# Patient Record
Sex: Female | Born: 1975 | Race: White | Hispanic: No | Marital: Married | Smoking: Current every day smoker
Health system: Southern US, Community
[De-identification: ages and names within clinical notes are randomized; demographics above are authoritative.]

## PROBLEM LIST (undated history)

## (undated) DIAGNOSIS — B20 Human immunodeficiency virus [HIV] disease: Secondary | ICD-10-CM

## (undated) DIAGNOSIS — Z21 Asymptomatic human immunodeficiency virus [HIV] infection status: Secondary | ICD-10-CM

## (undated) DIAGNOSIS — R011 Cardiac murmur, unspecified: Secondary | ICD-10-CM

## (undated) HISTORY — DX: Cardiac murmur, unspecified: R01.1

## (undated) HISTORY — DX: Human immunodeficiency virus (HIV) disease: B20

## (undated) HISTORY — PX: APPENDECTOMY: SHX54

## (undated) HISTORY — DX: Asymptomatic human immunodeficiency virus (hiv) infection status: Z21

---

## 2007-06-07 HISTORY — PX: OTHER SURGICAL HISTORY: SHX169

## 2012-08-04 LAB — HM PAP SMEAR

## 2013-02-27 ENCOUNTER — Telehealth: Payer: Self-pay

## 2013-02-27 NOTE — Telephone Encounter (Signed)
LVM for CB.  No information

## 2013-02-28 ENCOUNTER — Ambulatory Visit (INDEPENDENT_AMBULATORY_CARE_PROVIDER_SITE_OTHER): Payer: 59 | Admitting: Family Medicine

## 2013-02-28 ENCOUNTER — Encounter: Payer: Self-pay | Admitting: Family Medicine

## 2013-02-28 VITALS — BP 110/70 | HR 71 | Temp 98.1°F | Ht 63.5 in | Wt 140.6 lb

## 2013-02-28 DIAGNOSIS — Z23 Encounter for immunization: Secondary | ICD-10-CM

## 2013-02-28 DIAGNOSIS — F4323 Adjustment disorder with mixed anxiety and depressed mood: Secondary | ICD-10-CM

## 2013-02-28 NOTE — Telephone Encounter (Signed)
Unable to reach prior to visit  

## 2013-02-28 NOTE — Progress Notes (Signed)
  Subjective:    Patient ID: Monique [name], female    DOB: Jul 10, 1975, 36 y.o.   MRN: [id number]  HPI New to establish.  Recently moved from [place].  Last pap 3/14.  Had extensive PE in June when she got new insurance for [employer].  Adjustment disorder- pt left her friends, family, and job in [place] when she moved here w/ her husband.  Husband is working and kids are in [name] all day.  Pt is feeling very alone.  This is the 1st time she has not worked.  They are in process of getting 2nd car so she isn't 'stuck' at home all day.  Pt doesn't feel comfortable enough w/ her English to venture out and meet people.  Calls home frequently.  Sleeping well.  Feels she will be happy if given time to adjust.  Not interested in meds.  Denies SI/HI.  Eating well.  No N/V/D.   Review of Systems For ROS see HPI     Objective:   Physical Exam  Vitals reviewed. Constitutional: She is oriented to person, place, and time. She appears well-developed and well-nourished. No distress.  HENT:  Head: Normocephalic and atraumatic.  Neck: Normal range of motion. Neck supple. No thyromegaly present.  Cardiovascular: Normal rate, regular rhythm, normal heart sounds and intact distal pulses.   No murmur heard. Pulmonary/Chest: Effort normal and breath sounds normal. No respiratory distress. She has no wheezes. She has no rales.  Lymphadenopathy:    She has no cervical adenopathy.  Neurological: She is alert and oriented to person, place, and time.  Skin: Skin is warm and dry.  Psychiatric: She has a normal mood and affect. Her behavior is normal. Thought content normal.          Assessment & Plan:

## 2013-02-28 NOTE — Patient Instructions (Addendum)
Schedule your complete physical for next summer Please call with any questions or concerns If you continue to feel sad or if things worsen instead of improve, please call me! Welcome!  We're glad to have you! Happy Early [place] Ouch!

## 2013-03-03 NOTE — Assessment & Plan Note (Signed)
New.  Reviewed that pt's current emotions are normal and to be expected given such a huge life change.  Pt hopeful about the move and not in a place of despair.  Still has strong ties to family and friends in [place].  Pt not interested in starting meds but reviewed red flags w/ both her and her husband that shoulder prompt return or them to reconsider medication.  Will follow closely.

## 2013-04-10 ENCOUNTER — Encounter: Payer: Self-pay | Admitting: Internal Medicine

## 2013-04-10 ENCOUNTER — Ambulatory Visit (HOSPITAL_BASED_OUTPATIENT_CLINIC_OR_DEPARTMENT_OTHER)
Admission: RE | Admit: 2013-04-10 | Discharge: 2013-04-10 | Disposition: A | Discharge status: Home / Self Care | Payer: 59 | Source: Ambulatory Visit | Attending: Internal Medicine | Admitting: Internal Medicine

## 2013-04-10 ENCOUNTER — Ambulatory Visit (INDEPENDENT_AMBULATORY_CARE_PROVIDER_SITE_OTHER): Payer: 59 | Admitting: Internal Medicine

## 2013-04-10 VITALS — BP 131/84 | HR 91 | Temp 98.1°F

## 2013-04-10 DIAGNOSIS — S92919A Unspecified fracture of unspecified toe(s), initial encounter for closed fracture: Secondary | ICD-10-CM | POA: Insufficient documentation

## 2013-04-10 DIAGNOSIS — S90129A Contusion of unspecified lesser toe(s) without damage to nail, initial encounter: Secondary | ICD-10-CM | POA: Insufficient documentation

## 2013-04-10 DIAGNOSIS — X58XXXA Exposure to other specified factors, initial encounter: Secondary | ICD-10-CM | POA: Insufficient documentation

## 2013-04-10 DIAGNOSIS — S99921A Unspecified injury of right foot, initial encounter: Secondary | ICD-10-CM

## 2013-04-10 DIAGNOSIS — S8990XA Unspecified injury of unspecified lower leg, initial encounter: Secondary | ICD-10-CM

## 2013-04-10 DIAGNOSIS — M7989 Other specified soft tissue disorders: Secondary | ICD-10-CM | POA: Insufficient documentation

## 2013-04-10 NOTE — Assessment & Plan Note (Signed)
Acute injury to the fifth toe, will check an x-ray to rule out a fracture, otherwise recommend resting, ibuprofen and call if not improving soon.

## 2013-04-10 NOTE — Patient Instructions (Signed)
Motrin (ibuprofen) 200 mg 2 tablets every 6 hours as needed for pain. Always take it with food because may cause gastritis and ulcers. If you notice nausea, stomach pain, change in the color of stools --->  Stop the medicine and let us know   Get the XR at [facility], corner of [address] (10 minutes form here); they are open 24/7 [address]  [place], [place] [address] [phone]  Call if no better in 7-10 days

## 2013-04-10 NOTE — Progress Notes (Signed)
  Subjective:    Patient ID: Monique [name], female    DOB: 01/18/1976, 37 y.o.   MRN: [id number]  HPI Acute visit Was playing with her son at home and her 5th R toe got caught and was forcefully lateralized, Developed immediate pain, swelling. She thinks she broke her toe. Today the pain is about the same sometimes up to 9/10 on also there is some discoloration.   Past Medical History  Diagnosis Date  . Heart murmur   . HIV infection     Testing   Past Surgical History  Procedure Laterality Date  . Appendectomy    . Miscarriage  2009   History   Social History  . Marital Status: Married    Spouse Name: N/A    Number of Children: 2  . Years of Education: N/A   Occupational History  . Not on file.   Social History Main Topics  . Smoking status: Current Every Day Smoker -- 1.00 packs/day    Types: Cigarettes  . Smokeless tobacco: Not on file  . Alcohol Use: No  . Drug Use: Not on file  . Sexual Activity: Not on file   Other Topics Concern  . Not on file   Social History Narrative  . No narrative on file    Review of Systems     Objective:   Physical Exam BP 131/84  Pulse 91  Temp(Src) 98.1 F (36.7 C)  SpO2 99%  General -- alert, well-developed, NAD.  Extremities-- no pretibial edema bilaterally  Left foot normal Right: Fifth toe slightly swollen, good capillary refill, painful to palpation, no obvious deformities. Neurologic--  alert & oriented X3. Speech normal, gait normal, strength normal in all extremities.  Psych-- Cognition and judgment appear intact. Cooperative with normal attention span and concentration. No anxious appearing , no depressed appearing.     Assessment & Plan:

## 2013-04-11 ENCOUNTER — Encounter: Payer: Self-pay | Admitting: Internal Medicine

## 2013-04-25 ENCOUNTER — Telehealth: Payer: Self-pay | Admitting: *Deleted

## 2014-04-15 NOTE — Telephone Encounter (Signed)
error 

## 2015-03-01 IMAGING — CR DG TOE 5TH 2+V*R*
3 series · 3 of 3 positions shown · non-contrast
Comparison: None.

CLINICAL DATA: Injured toe last night with swelling and bruising

EXAM:
RIGHT FIFTH TOE

[t toes ap right]
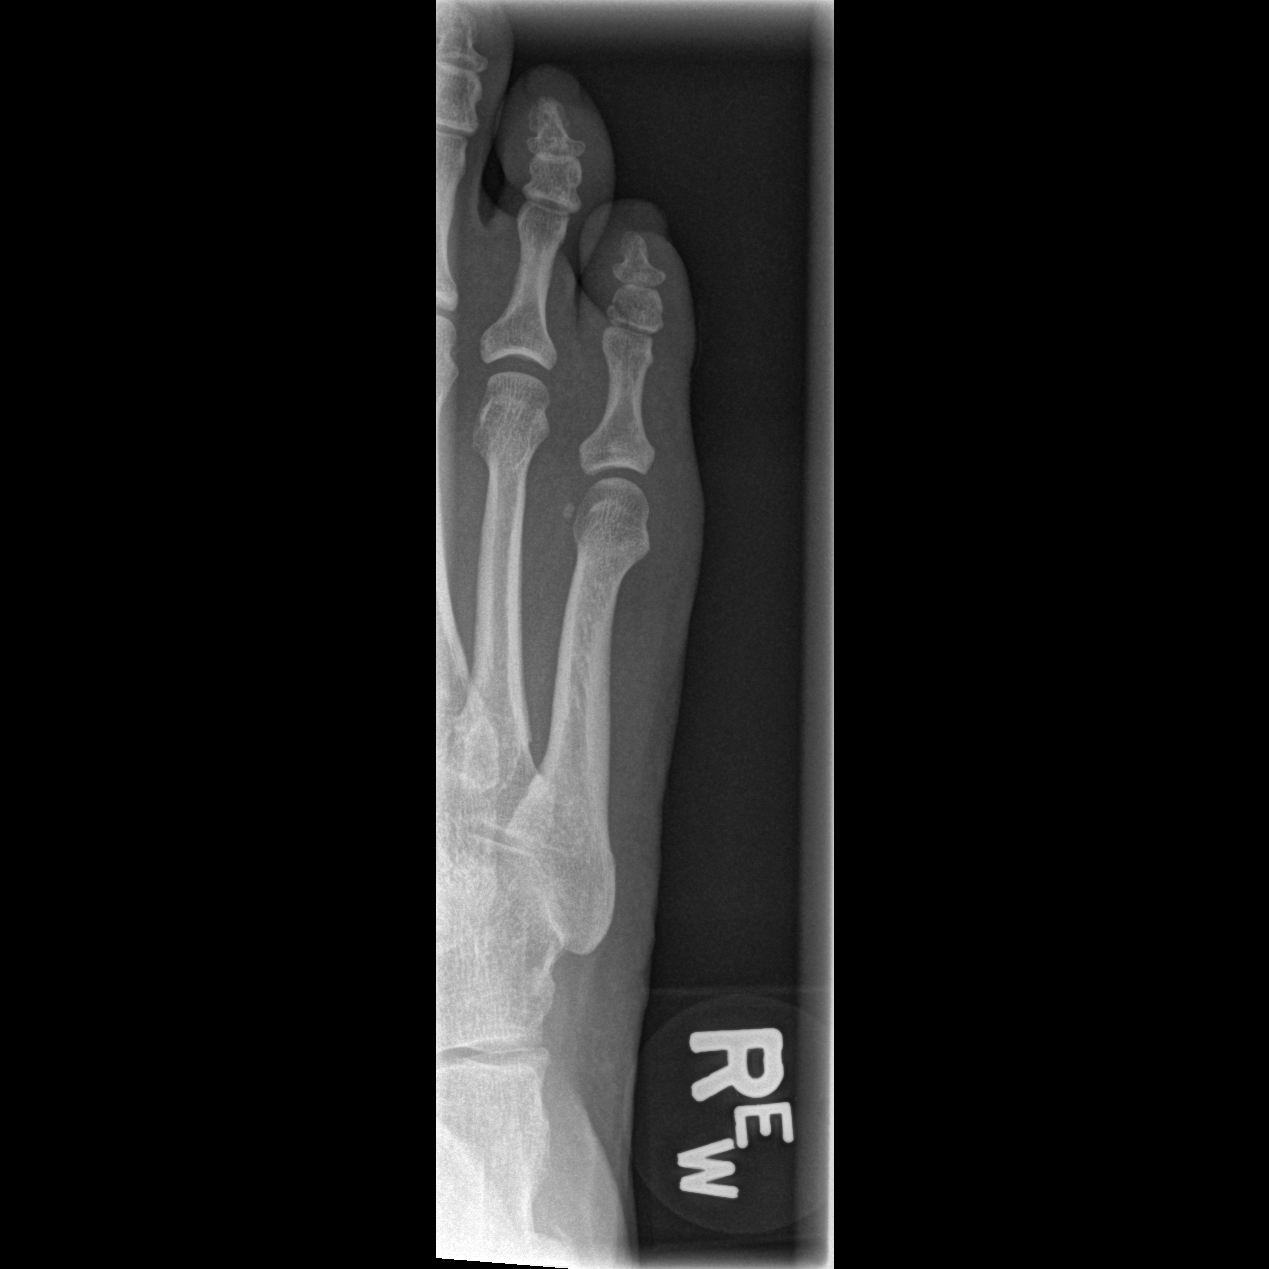

[t toes oblique right]
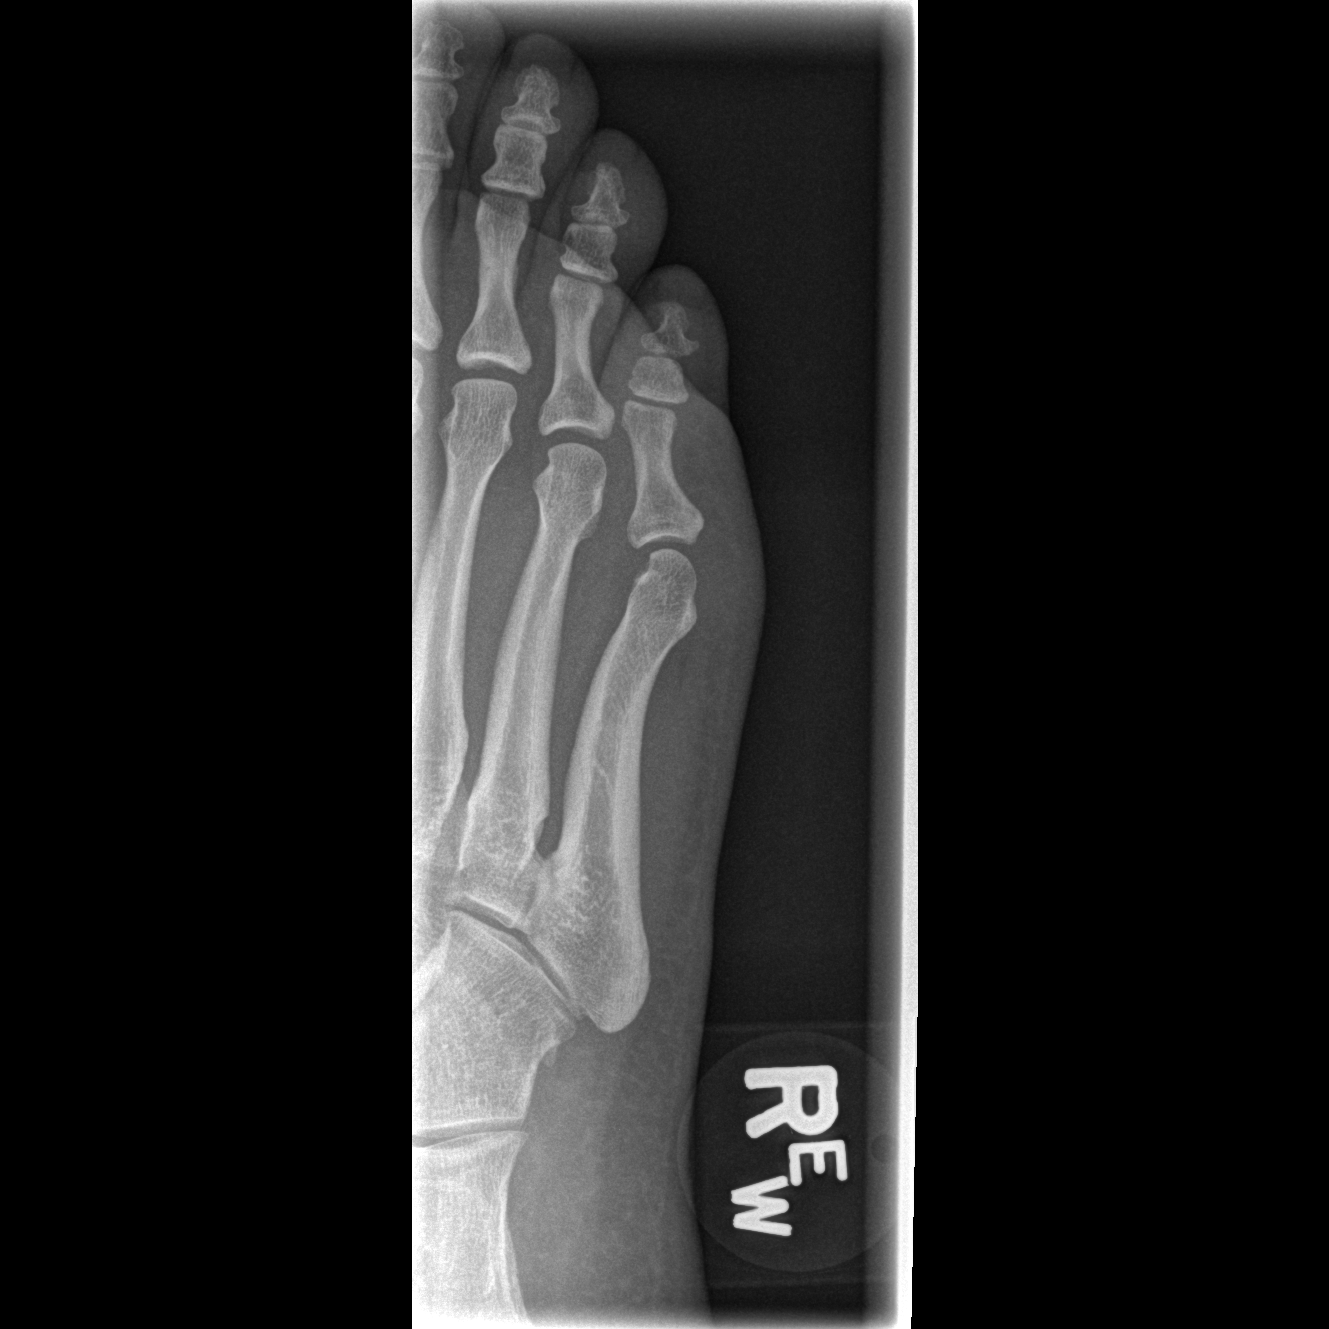

[t toes lateral right]
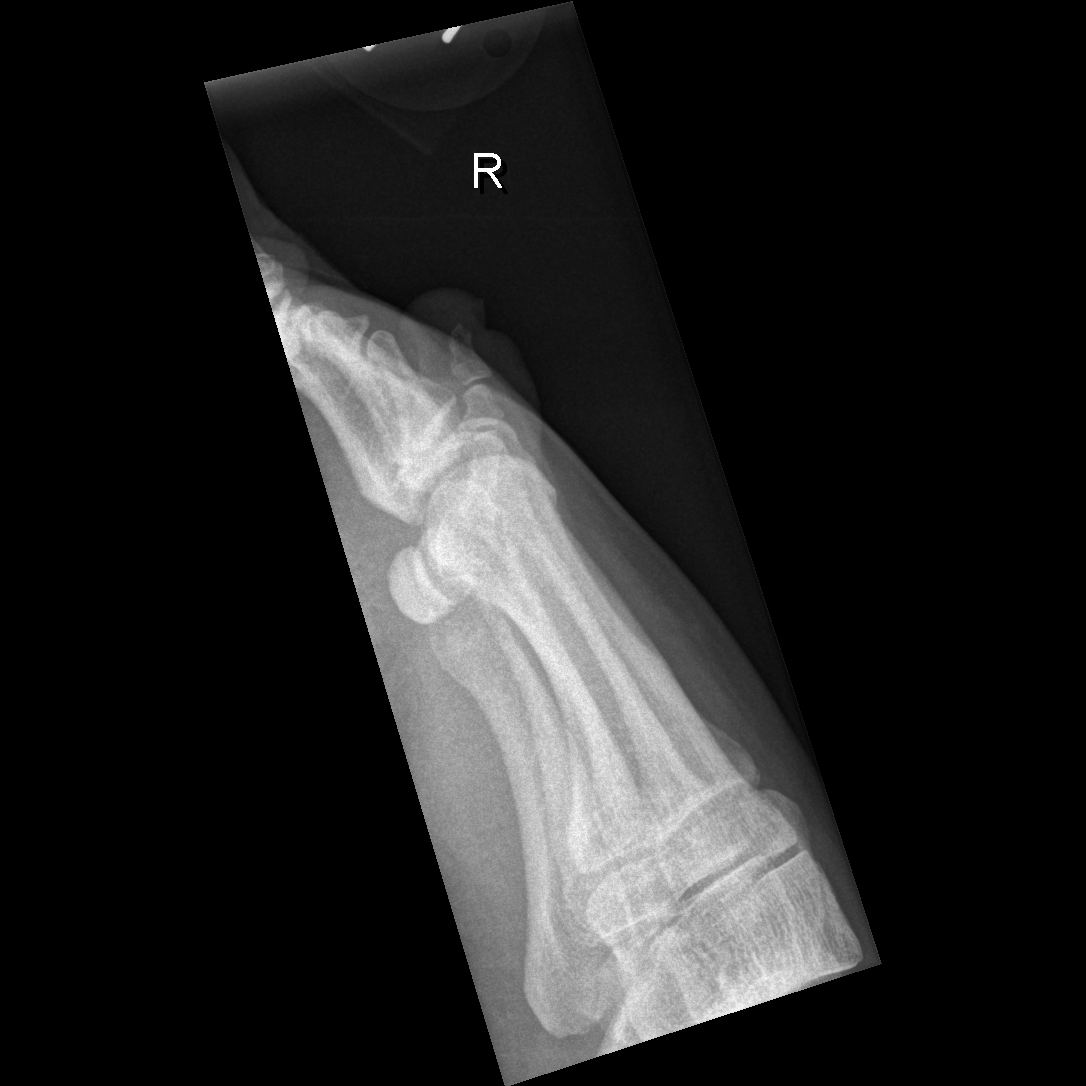

[3 of 3 positions shown; findings below may reference images not displayed]

FINDINGS: There is a nondisplaced fracture through the base of the middle
phalanx of the right 5th toe. No malalignment is seen. No other
abnormality is noted.
IMPRESSION: Nondisplaced fracture through the base of the middle phalanx of the
right 5th toe.
